# Patient Record
Sex: Male | Born: 1990
Health system: Southern US, Community
[De-identification: ages and names within clinical notes are randomized; demographics above are authoritative.]

## PROBLEM LIST (undated history)

## (undated) DIAGNOSIS — R748 Abnormal levels of other serum enzymes: Secondary | ICD-10-CM

## (undated) DIAGNOSIS — E559 Vitamin D deficiency, unspecified: Secondary | ICD-10-CM

## (undated) DIAGNOSIS — R030 Elevated blood-pressure reading, without diagnosis of hypertension: Secondary | ICD-10-CM

## (undated) DIAGNOSIS — E039 Hypothyroidism, unspecified: Secondary | ICD-10-CM

## (undated) DIAGNOSIS — R5383 Other fatigue: Secondary | ICD-10-CM

## (undated) DIAGNOSIS — E669 Obesity, unspecified: Secondary | ICD-10-CM

## (undated) DIAGNOSIS — Z87898 Personal history of other specified conditions: Secondary | ICD-10-CM

## (undated) HISTORY — DX: Abnormal levels of other serum enzymes: R74.8

## (undated) HISTORY — DX: Vitamin D deficiency, unspecified: E55.9

## (undated) HISTORY — DX: Obesity, unspecified: E66.9

## (undated) HISTORY — PX: HERNIA REPAIR: SHX51

## (undated) HISTORY — DX: Elevated blood-pressure reading, without diagnosis of hypertension: R03.0

## (undated) HISTORY — DX: Other fatigue: R53.83

## (undated) HISTORY — DX: Hypothyroidism, unspecified: E03.9

## (undated) HISTORY — DX: Personal history of other specified conditions: Z87.898

---
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
## 2010-05-16 ENCOUNTER — Emergency Department (HOSPITAL_COMMUNITY): Payer: No Typology Code available for payment source

## 2010-05-16 ENCOUNTER — Emergency Department (HOSPITAL_COMMUNITY)
Admission: EM | Admit: 2010-05-16 | Discharge: 2010-05-16 | Disposition: A | Discharge status: Home / Self Care | Payer: No Typology Code available for payment source | Attending: Emergency Medicine | Admitting: Emergency Medicine

## 2010-05-16 DIAGNOSIS — R079 Chest pain, unspecified: Secondary | ICD-10-CM | POA: Insufficient documentation

## 2010-05-16 DIAGNOSIS — M546 Pain in thoracic spine: Secondary | ICD-10-CM | POA: Insufficient documentation

## 2010-05-16 DIAGNOSIS — IMO0002 Reserved for concepts with insufficient information to code with codable children: Secondary | ICD-10-CM | POA: Insufficient documentation

## 2010-07-25 NOTE — Op Note (Signed)
Tanner Martin, Tanner Martin             ACCOUNT NO.:  [id number]   MEDICAL RECORD NO.:  [id number]          PATIENT TYPE:  AMB   LOCATION:  DSC                          FACILITY:  [facility]   PHYSICIAN:  Cherylynn Ridges, M.D.    DATE OF BIRTH:  1990/07/25   DATE OF PROCEDURE:  05/27/2006  DATE OF DISCHARGE:                               OPERATIVE REPORT   PREOPERATIVE DIAGNOSIS:  Right inguinal hernia.   POSTOPERATIVE DIAGNOSIS:  Indirect right inguinal hernia.   PROCEDURE:  Right inguinal hernia repair with mesh.   SURGEON:  Cherylynn Ridges, M.D.   ANESTHESIA:  General.   ESTIMATED BLOOD LOSS:  Less than 20 mL.   COMPLICATIONS:  None.   CONDITION:  Stable.   FINDINGS:  Indirect sac associated with a lipoma of the cord and small  weakness in the floor or direct defect.   INDICATIONS FOR OPERATION:  The patient is a 20 year old with a  symptomatic right inguinal hernia who comes in for repair.   OPERATION:  The patient was taken to the operating room and placed on  the table in the supine position.  After an adequate laryngeal airway  anesthetic was administered, he was prepped and draped in the usual  sterile manner exposing the right groin.   A transverse curvilinear incision was made at the level of the  superficial ring using a #10 blade.  This incision was about 5 cm long.  It was taken down to and through Scarpa's fascia.  We ligated the vein  in the subcu with 3-0 Vicryl.  We exposed the external oblique fascia  which was split along its fibers through the superficial ring.  We  preserved the ilioinguinal and iliohypogastric nerves.  We retracted the  spermatic cord with a Penrose drain at the pubic tubercle and dissected  away the cremasteric muscles.  We then placed the spermatic cord on a  work bench between the Penrose drain and then dissected out the indirect  sac at the anterior medial aspect of the cord.  We dissected down to the  base of the neck,  ligated it with a  hemostat clamp, and 2-0 Ethibond  suture ligatures at the base.  The excess sac and lipoma was resected.   We then repaired the floor using an oval piece of mesh measuring  approximately 4 x 2 cm in size, attaching it to the pubic tubercle area  and to the conjoined tendon anterior medially and the reflected portion  of the inguinal ligament inferolaterally.  This was done using a running  0 Prolene suture.  Antibiotic solution was used to irrigate the wound  and also to soak the mesh.  Once the mesh was implanted, we closed the  external oblique fascia on top of the cord using running 3-0 Vicryl  suture, taking care not to entrap the iliohypogastric and ilioinguinal  nerve.  Once this was done, we reapproximated Scarpa's fascia using  interrupted 3-0 Vicryl sutures and then the skin was closed using a  running subcuticular stitch of 4-0 Monocryl.  We injected 0.5% Marcaine  with epi into  the wound including a regional block at the anterior  superior iliac spine, a total of 16 mL was used.  A sterile dressing was  applied.  All needle counts,, sponge counts, and instrument counts were  correct.      Cherylynn Ridges, M.D.  Electronically Signed     JOW/MEDQ  D:  05/27/2006  T:  05/27/2006  Job:  [id number]

## 2011-01-08 ENCOUNTER — Emergency Department (HOSPITAL_COMMUNITY)
Admission: EM | Admit: 2011-01-08 | Discharge: 2011-01-08 | Disposition: A | Discharge status: Home / Self Care | Payer: No Typology Code available for payment source | Attending: Emergency Medicine | Admitting: Emergency Medicine

## 2011-01-08 ENCOUNTER — Emergency Department (HOSPITAL_COMMUNITY): Payer: No Typology Code available for payment source

## 2011-01-08 DIAGNOSIS — M545 Low back pain, unspecified: Secondary | ICD-10-CM | POA: Insufficient documentation

## 2011-07-12 ENCOUNTER — Encounter (HOSPITAL_COMMUNITY): Payer: Self-pay | Admitting: Emergency Medicine

## 2011-07-12 ENCOUNTER — Emergency Department (HOSPITAL_COMMUNITY)
Admission: EM | Admit: 2011-07-12 | Discharge: 2011-07-12 | Disposition: A | Discharge status: Home / Self Care | Attending: Emergency Medicine | Admitting: Emergency Medicine

## 2011-07-12 ENCOUNTER — Emergency Department (HOSPITAL_COMMUNITY)

## 2011-07-12 DIAGNOSIS — T07XXXA Unspecified multiple injuries, initial encounter: Secondary | ICD-10-CM

## 2011-07-12 DIAGNOSIS — S42023A Displaced fracture of shaft of unspecified clavicle, initial encounter for closed fracture: Secondary | ICD-10-CM | POA: Insufficient documentation

## 2011-07-12 DIAGNOSIS — S42002A Fracture of unspecified part of left clavicle, initial encounter for closed fracture: Secondary | ICD-10-CM

## 2011-07-12 DIAGNOSIS — IMO0002 Reserved for concepts with insufficient information to code with codable children: Secondary | ICD-10-CM | POA: Insufficient documentation

## 2011-07-12 MED ORDER — OXYCODONE-ACETAMINOPHEN 5-325 MG PO TABS: 2.0000 | ORAL_TABLET | ORAL | Status: AC | PRN

## 2011-07-12 MED ORDER — OXYCODONE-ACETAMINOPHEN 5-325 MG PO TABS
2.0000 | ORAL_TABLET | Freq: Once | ORAL | Status: AC
  Administered 2011-07-12: 2 via ORAL
  Filled 2011-07-12: qty 2

## 2011-07-12 MED ORDER — TETANUS-DIPHTH-ACELL PERTUSSIS 5-2.5-18.5 LF-MCG/0.5 IM SUSP
0.5000 mL | Freq: Once | INTRAMUSCULAR | Status: AC
  Administered 2011-07-12: 0.5 mL via INTRAMUSCULAR
  Filled 2011-07-12: qty 0.5
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
## 2011-07-12 NOTE — Discharge Instructions (Signed)
Deep wounds clean and dry. Wash with soap and water and cover with Band-Aid. Followup with orthopedist in the next week. Call on Monday for appointment. Wear sling for comfort. Take pain medicine as prescribed.  Abrasions Abrasions are skin scrapes. Their treatment depends on how large and deep the abrasion is. Abrasions do not extend through all layers of the skin. A cut or lesion through all skin layers is called a laceration. HOME CARE INSTRUCTIONS   If you were given a dressing, change it at least once a day or as instructed by your caregiver. If the bandage sticks, soak it off with a solution of water or hydrogen peroxide.   Twice a day, wash the area with soap and water to remove all the cream/ointment. You may do this in a sink, under a tub faucet, or in a shower. Rinse off the soap and pat dry with a clean towel. Look for signs of infection (see below).   Reapply cream/ointment according to your caregiver's instruction. This will help prevent infection and keep the bandage from sticking. Telfa or gauze over the wound and under the dressing or wrap will also help keep the bandage from sticking.   If the bandage becomes wet, dirty, or develops a foul smell, change it as soon as possible.   Only take over-the-counter or prescription medicines for pain, discomfort, or fever as directed by your caregiver.  SEEK IMMEDIATE MEDICAL CARE IF:   Increasing pain in the wound.   Signs of infection develop: redness, swelling, surrounding area is tender to touch, or pus coming from the wound.   You have a fever.   Any foul smell coming from the wound or dressing.  Most skin wounds heal within ten days. Facial wounds heal faster. However, an infection may occur despite proper treatment. You should have the wound checked for signs of infection within 24 to 48 hours or sooner if problems arise. If you were not given a wound-check appointment, look closely at the wound yourself on the second day for  early signs of infection listed above. MAKE SURE YOU:   Understand these instructions.   Will watch your condition.   Will get help right away if you are not doing well or get worse.  Document Released: 12/03/2004 Document Revised: 02/12/2011 Document Reviewed: 01/27/2011 [facility] Patient Information 2012 [place], [place]. Abrasions Abrasions are skin scrapes. Their treatment depends on how large and deep the abrasion is. Abrasions do not extend through all layers of the skin. A cut or lesion through all skin layers is called a laceration. HOME CARE INSTRUCTIONS   If you were given a dressing, change it at least once a day or as instructed by your caregiver. If the bandage sticks, soak it off with a solution of water or hydrogen peroxide.   Twice a day, wash the area with soap and water to remove all the cream/ointment. You may do this in a sink, under a tub faucet, or in a shower. Rinse off the soap and pat dry with a clean towel. Look for signs of infection (see below).   Reapply cream/ointment according to your caregiver's instruction. This will help prevent infection and keep the bandage from sticking. Telfa or gauze over the wound and under the dressing or wrap will also help keep the bandage from sticking.   If the bandage becomes wet, dirty, or develops a foul smell, change it as soon as possible.   Only take over-the-counter or prescription medicines for pain, discomfort, or  fever as directed by your caregiver.  SEEK IMMEDIATE MEDICAL CARE IF:   Increasing pain in the wound.   Signs of infection develop: redness, swelling, surrounding area is tender to touch, or pus coming from the wound.   You have a fever.   Any foul smell coming from the wound or dressing.  Most skin wounds heal within ten days. Facial wounds heal faster. However, an infection may occur despite proper treatment. You should have the wound checked for signs of infection within 24 to 48 hours or sooner if  problems arise. If you were not given a wound-check appointment, look closely at the wound yourself on the second day for early signs of infection listed above. MAKE SURE YOU:   Understand these instructions.   Will watch your condition.   Will get help right away if you are not doing well or get worse.  Document Released: 12/03/2004 Document Revised: 02/12/2011 Document Reviewed: 01/27/2011 [facility] Patient Information 2012 [place], [place].
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
## 2015-09-04 ENCOUNTER — Other Ambulatory Visit: Payer: Self-pay

## 2015-09-04 ENCOUNTER — Ambulatory Visit (INDEPENDENT_AMBULATORY_CARE_PROVIDER_SITE_OTHER): Payer: 59 | Admitting: Family Medicine

## 2015-09-04 ENCOUNTER — Encounter: Payer: Self-pay | Admitting: Family Medicine

## 2015-09-04 VITALS — BP 132/84 | HR 65 | Ht 67.5 in | Wt 241.8 lb

## 2015-09-04 DIAGNOSIS — E669 Obesity, unspecified: Secondary | ICD-10-CM

## 2015-09-04 DIAGNOSIS — R5383 Other fatigue: Secondary | ICD-10-CM

## 2015-09-04 DIAGNOSIS — R030 Elevated blood-pressure reading, without diagnosis of hypertension: Secondary | ICD-10-CM

## 2015-09-04 DIAGNOSIS — Z723 Lack of physical exercise: Secondary | ICD-10-CM

## 2015-09-04 HISTORY — DX: Elevated blood-pressure reading, without diagnosis of hypertension: R03.0

## 2015-09-04 HISTORY — DX: Obesity, unspecified: E66.9

## 2015-09-04 HISTORY — DX: Other fatigue: R53.83

## 2015-09-04 LAB — CBC WITH DIFFERENTIAL/PLATELET
BASOS ABS: 0 {cells}/uL (ref 0–200)
Basophils Relative: 0 %
EOS ABS: 244 {cells}/uL (ref 15–500)
EOS PCT: 4 %
HEMATOCRIT: 47.9 % (ref 38.5–50.0)
HEMOGLOBIN: 16.7 g/dL (ref 13.2–17.1)
LYMPHS ABS: 2074 {cells}/uL (ref 850–3900)
Lymphocytes Relative: 34 %
MCH: 34.2 pg — AB (ref 27.0–33.0)
MCHC: 34.9 g/dL (ref 32.0–36.0)
MCV: 98 fL (ref 80.0–100.0)
MONO ABS: 610 {cells}/uL (ref 200–950)
MPV: 11.6 fL (ref 7.5–12.5)
Monocytes Relative: 10 %
NEUTROS ABS: 3172 {cells}/uL (ref 1500–7800)
NEUTROS PCT: 52 %
Platelets: 162 10*3/uL (ref 140–400)
RBC: 4.89 MIL/uL (ref 4.20–5.80)
RDW: 13.4 % (ref 11.0–15.0)
WBC: 6.1 10*3/uL (ref 3.8–10.8)

## 2015-09-04 NOTE — Assessment & Plan Note (Addendum)
Connection between being overweight and various diseases reviewed with patient. Advised weight loss. Briefly discussed med management and patient will let me know in the future if this is something he would like to pursue.  We will obtain blood work to screen for diabetes, thyroid abnormality, hyperlipidemia etc.

## 2015-09-04 NOTE — Progress Notes (Signed)
Tanner Martin, D.O. [facility]  Subjective:    CC: New pt, here to establish care.   HPI: Tanner RainwaterJeffrey Martin is a pleasant 25 y.o. male who [facility]   Last seen physician in over a year ago. Saw Dr Jorge MandrilHiltz at [facility].    No active other issues.    Works at [employer].  [employer]achelor's at [employer] in [employer].   Has GF- 3 yrs, monogamous.       Past Medical History  Diagnosis Date  . No significant past medical history     Past Surgical History  Procedure Laterality Date  . Hernia repair      Family History  Problem Relation Age of Onset  . Healthy Mother   . Hypertension Father   . Healthy Brother   . Dementia Maternal Grandmother   . Heart attack Paternal Grandfather   . Healthy Brother     History  Drug Use No  ,  History  Alcohol Use  . 7.2 oz/week  . 12 Shots of liquor per week  ,  History  Smoking status  . Never Smoker   Smokeless tobacco  . Never Used  ,  History  Sexual Activity  . Sexual Activity: Yes  . Birth Control/ Protection: Condom    Patient's Medications   No medications on file    ALLERGIES: Ceclor  Review of Systems:   ( Completed via her Adult Medical History Intake form today ) General:   Denies fever, chills, appetite changes, unexplained weight loss.  Optho/Auditory:   Denies visual changes, blurred vision/LOV, ringing in ears/ diff hearing Respiratory:   Denies SOB, DOE, cough, wheezing.  Cardiovascular:   Denies chest pain, palpitations, new onset peripheral edema  Gastrointestinal:   Denies nausea, vomiting, diarrhea.  Genitourinary:    Denies dysuria, increased frequency, flank pain.  Endocrine:     Denies hot or cold intolerance, polyuria, polydipsia. Musculoskeletal:  Denies unexplained myalgias, joint swelling, arthralgias, gait problems.  Skin:  Denies rash, suspicious lesions or new/ changes in moles Neurological:    Denies  dizziness, syncope, unexplained weakness, lightheadedness, numbness  Psychiatric/Behavioral:   Denies mood changes, suicidal or homicidal ideations, hallucinations    Objective:   Blood pressure 132/84, pulse 65, height 5' 7.5" (1.715 m), weight 241 lb 12.8 oz (109.68 kg). Body mass index is 37.29 kg/(m^2).  General: Well Developed, well nourished, and in no acute distress.  Neuro: Alert and oriented x3, extra-ocular muscles intact, sensation grossly intact.  HEENT: Normocephalic, atraumatic, pupils equal round reactive to light, neck supple  Skin: no gross suspicious lesions or rashes  Cardiac: Regular rate and rhythm, no murmurs rubs or gallops.  Respiratory: Essentially clear to auscultation bilaterally. Not using accessory muscles, speaking in full sentences.  Abdominal: Soft, not grossly distended Musculoskeletal: Ambulates w/o diff, FROM * 4 ext.  Vasc: less 2 sec cap RF, warm and pink  Psych:  No HI/SI, judgement and insight good.    Impression and Recommendations:   Get fasting blood work on pt today.   ADVISED:  - low salt diet - stop caffeine, smoking - exercise- 30min daily, moderate intensity.  - wt loss-  BMI of 25 or less ( BMI calculator)  Look up Pre-Hypertension [email or web address].org  Prehypertension Counseled regarding the meaning of his blood pressure today. Please see AVS for further recommendations that were made and discussed with patient.  Handouts provided  Obesity Connection between being overweight and various diseases reviewed with patient. Advised weight loss. Briefly discussed med management and patient will let me know in the future if this is something he would like to pursue.  We will obtain blood work to screen for diabetes, thyroid abnormality, hyperlipidemia etc.  Mild Fatigue at times Obtain lab work today.  Inactivity Extensive routine counseling done regarding lifestyle modifications and recommendations from the American Heart Association  regarding exercise. Discussed with patient benefits of exercise.  The patient was counselled, risk factors were discussed, anticipatory guidance given.  Gross side effects, risk and benefits, and alternatives of medications discussed with patient.  Patient is aware that all medications have potential side effects and we are unable to predict every sideeffect or drug-drug interaction that may occur.  Expresses verbal understanding and consents to current therapy plan and treatment regiment.  Note: This document was prepared using Dragon voice recognition software and may include unintentional dictation errors.

## 2015-09-04 NOTE — Assessment & Plan Note (Signed)
Counseled regarding the meaning of his blood pressure today. Please see AVS for further recommendations that were made and discussed with patient. Handouts provided

## 2015-09-04 NOTE — Patient Instructions (Addendum)
- low salt diet - stop caffeine, smoking - exercise- daily, moderate intensity.  - wt loss-  BMI of 25 or less ( BMI calculator)  Pre-Hypertension [email or web address]     The Facts About High Blood Pressure Tweet  [id number]  Updated:May 10,2017   What is high blood pressure?  High blood pressure (HBP or hypertension) is when your blood pressure, the force of the blood flowing through your blood vessels, is consistently too high. If you have high blood pressure, you are not alone About 85 million Americans - one out of every three adults over age 25 - have high blood pressure. (Nearly one of out six don't even know they have it.)  The best way to know if you have high blood pressure it is to have your blood pressure checked. Know your numbers  Learn about checking your blood pressure numbers and what they mean. Blood Pressure Category Systolic mm Hg (upper #)  Diastolic mm Hg (lower #)  Normal less than 120  and less than 80   Prehypertension 120 - 139  or 80 - 89   High Blood Pressure (Hypertension) Stage 1 140 - 159  or 90 - 99   High Blood Pressure (Hypertension) Stage 2 160 or higher or 100 or higher  Hypertensive Crisis (Emergency care needed)  Higher than 180  or  Higher than 110   High blood pressure is a "silent killer"  Most of the time there are no obvious symptoms.  Certain physical traits and lifestyle choices can put you at a greater risk for developing high blood pressure.  When left untreated, the damage that high blood pressure does to your circulatory system is a significant contributing factor to heart attack, stroke and other health threats.     Managing Your High Blood Pressure Blood pressure is a measurement of how forceful your blood is pressing against the walls of the arteries. Arteries are muscular tubes within the circulatory system. Blood pressure does not stay the same. Blood pressure rises when you are active, excited, or nervous; and it  lowers during sleep and relaxation. If the numbers measuring your blood pressure stay above normal most of the time, you are at risk for health problems. High blood pressure (hypertension) is a long-term (chronic) condition in which blood pressure is elevated. A blood pressure reading is recorded as two numbers, such as 120 over 80 (or 120/80). The first, higher number is called the systolic pressure. It is a measure of the pressure in your arteries as the heart beats. The second, lower number is called the diastolic pressure. It is a measure of the pressure in your arteries as the heart relaxes between beats.  Keeping your blood pressure in a normal range is important to your overall health and prevention of health problems, such as heart disease and stroke. When your blood pressure is uncontrolled, your heart has to work harder than normal. High blood pressure is a very common condition in adults because blood pressure tends to rise with age. Men and women are equally likely to have hypertension but at different times in life. Before age 77, men are more likely to have hypertension. After 26 years of age, women are more likely to have it. Hypertension is especially common in African Americans. This condition often has no signs or symptoms. The cause of the condition is usually not known. Your caregiver can help you come up with a plan to keep your blood pressure in a normal, healthy  range. BLOOD PRESSURE STAGES Blood pressure is classified into four stages: normal, prehypertension, stage 1, and stage 2. Your blood pressure reading will be used to determine what type of treatment, if any, is necessary. Appropriate treatment options are tied to these four stages:  Normal  Systolic pressure (mm Hg): below 120.  Diastolic pressure (mm Hg): below 80. Prehypertension  Systolic pressure (mm Hg): 120 to 139.  Diastolic pressure (mm Hg): 80 to 89. Stage1  Systolic pressure (mm Hg): 140 to  159.  Diastolic pressure (mm Hg): 90 to 99. Stage2  Systolic pressure (mm Hg): 160 or above.  Diastolic pressure (mm Hg): 100 or above. RISKS RELATED TO HIGH BLOOD PRESSURE Managing your blood pressure is an important responsibility. Uncontrolled high blood pressure can lead to:  A heart attack.  A stroke.  A weakened blood vessel (aneurysm).  Heart failure.  Kidney damage.  Eye damage.  Metabolic syndrome.  Memory and concentration problems. HOW TO MANAGE YOUR BLOOD PRESSURE Blood pressure can be managed effectively with lifestyle changes and medicines (if needed). Your caregiver will help you come up with a plan to bring your blood pressure within a normal range. Your plan should include the following: Education  Read all information provided by your caregivers about how to control blood pressure.  Educate yourself on the latest guidelines and treatment recommendations. New research is always being done to further define the risks and treatments for high blood pressure. Lifestylechanges  Control your weight.  Avoid smoking.  Stay physically active.  Reduce the amount of salt in your diet.  Reduce stress.  Control any chronic conditions, such as high cholesterol or diabetes.  Reduce your alcohol intake. Medicines  Several medicines (antihypertensive medicines) are available, if needed, to bring blood pressure within a normal range. Communication  Review all the medicines you take with your caregiver because there may be side effects or interactions.  Talk with your caregiver about your diet, exercise habits, and other lifestyle factors that may be contributing to high blood pressure.  See your caregiver regularly. Your caregiver can help you create and adjust your plan for managing high blood pressure. RECOMMENDATIONS FOR TREATMENT AND FOLLOW-UP  The following recommendations are based on current guidelines for managing high blood pressure in nonpregnant  adults. Use these recommendations to identify the proper follow-up period or treatment option based on your blood pressure reading. You can discuss these options with your caregiver.  Systolic pressure of 120 to 139 or diastolic pressure of 80 to 89: Follow up with your caregiver as directed.  Systolic pressure of 140 to 160 or diastolic pressure of 90 to 100: Follow up with your caregiver within 2 months.  Systolic pressure above 160 or diastolic pressure above 100: Follow up with your caregiver within 1 month.  Systolic pressure above 180 or diastolic pressure above 110: Consider antihypertensive therapy; follow up with your caregiver within 1 week.  Systolic pressure above 200 or diastolic pressure above 120: Begin antihypertensive therapy; follow up with your caregiver within 1 week.   This information is not intended to replace advice given to you by your health care provider. Make sure you discuss any questions you have with your health care provider.   Document Released: 11/18/2011 Document Reviewed: 11/18/2011 Elsevier Interactive Patient Education 2016 [employer]     Hypertension Hypertension, commonly called high blood pressure, is when the force of blood pumping through your arteries is too strong. Your arteries are the blood vessels that carry blood from  your heart throughout your body. A blood pressure reading consists of a higher number over a lower number, such as 110/72. The higher number (systolic) is the pressure inside your arteries when your heart pumps. The lower number (diastolic) is the pressure inside your arteries when your heart relaxes. Ideally you want your blood pressure below 120/80. Hypertension forces your heart to work harder to pump blood. Your arteries may become narrow or stiff. Having untreated or uncontrolled hypertension can cause heart attack, stroke, kidney disease, and other problems. RISK FACTORS Some risk factors for high blood pressure are  controllable. Others are not.  Risk factors you cannot control include:   Race. You may be at higher risk if you are African American.  Age. Risk increases with age.  Gender. Men are at higher risk than women before age 26 years. After age 26, women are at higher risk than men. Risk factors you can control include:  Not getting enough exercise or physical activity.  Being overweight.  Getting too much fat, sugar, calories, or salt in your diet.  Drinking too much alcohol. SIGNS AND SYMPTOMS Hypertension does not usually cause signs or symptoms. Extremely high blood pressure (hypertensive crisis) may cause headache, anxiety, shortness of breath, and nosebleed. DIAGNOSIS To check if you have hypertension, your health care provider will measure your blood pressure while you are seated, with your arm held at the level of your heart. It should be measured at least twice using the same arm. Certain conditions can cause a difference in blood pressure between your right and left arms. A blood pressure reading that is higher than normal on one occasion does not mean that you need treatment. If it is not clear whether you have high blood pressure, you may be asked to return on a different day to have your blood pressure checked again. Or, you may be asked to monitor your blood pressure at home for 1 or more weeks. TREATMENT Treating high blood pressure includes making lifestyle changes and possibly taking medicine. Living a healthy lifestyle can help lower high blood pressure. You may need to change some of your habits. Lifestyle changes may include:  Following the DASH diet. This diet is high in fruits, vegetables, and whole grains. It is low in salt, red meat, and added sugars.  Keep your sodium intake below 2,300 mg per day.  Getting at least 30-45 minutes of aerobic exercise at least 4 times per week.  Losing weight if necessary.  Not smoking.  Limiting alcoholic beverages.  Learning  ways to reduce stress. Your health care provider may prescribe medicine if lifestyle changes are not enough to get your blood pressure under control, and if one of the following is true:  You are 618-26 years of age and your systolic blood pressure is above 140.  You are 26 years of age or older, and your systolic blood pressure is above 150.  Your diastolic blood pressure is above 90.  You have diabetes, and your systolic blood pressure is over 140 or your diastolic blood pressure is over 90.  You have kidney disease and your blood pressure is above 140/90.  You have heart disease and your blood pressure is above 140/90. Your personal target blood pressure may vary depending on your medical conditions, your age, and other factors. HOME CARE INSTRUCTIONS  Have your blood pressure rechecked as directed by your health care provider.   Take medicines only as directed by your health care provider. Follow the directions carefully.  Blood pressure medicines must be taken as prescribed. The medicine does not work as well when you skip doses. Skipping doses also puts you at risk for problems.  Do not smoke.   Monitor your blood pressure at home as directed by your health care provider. SEEK MEDICAL CARE IF:   You think you are having a reaction to medicines taken.  You have recurrent headaches or feel dizzy.  You have swelling in your ankles.  You have trouble with your vision. SEEK IMMEDIATE MEDICAL CARE IF:  You develop a severe headache or confusion.  You have unusual weakness, numbness, or feel faint.  You have severe chest or abdominal pain.  You vomit repeatedly.  You have trouble breathing. MAKE SURE YOU:   Understand these instructions.  Will watch your condition.  Will get help right away if you are not doing well or get worse.   This information is not intended to replace advice given to you by your health care provider. Make sure you discuss any questions you  have with your health care provider.   Document Released: 02/23/2005 Document Revised: 07/10/2014 Document Reviewed: 12/16/2012 Elsevier Interactive Patient Education [employer]

## 2015-09-04 NOTE — Assessment & Plan Note (Signed)
Obtain lab work today

## 2015-09-04 NOTE — Assessment & Plan Note (Signed)
Extensive routine counseling done regarding lifestyle modifications and recommendations from the American Heart Association regarding exercise. Discussed with patient benefits of exercise.

## 2015-09-05 LAB — COMPREHENSIVE METABOLIC PANEL
ALBUMIN: 4.2 g/dL (ref 3.6–5.1)
ALK PHOS: 47 U/L (ref 40–115)
ALT: 271 U/L — ABNORMAL HIGH (ref 9–46)
AST: 131 U/L — ABNORMAL HIGH (ref 10–40)
BILIRUBIN TOTAL: 1.3 mg/dL — AB (ref 0.2–1.2)
BUN: 13 mg/dL (ref 7–25)
CALCIUM: 9.1 mg/dL (ref 8.6–10.3)
CHLORIDE: 102 mmol/L (ref 98–110)
CO2: 26 mmol/L (ref 20–31)
Creat: 0.92 mg/dL (ref 0.60–1.35)
GLUCOSE: 92 mg/dL (ref 65–99)
Potassium: 3.7 mmol/L (ref 3.5–5.3)
SODIUM: 138 mmol/L (ref 135–146)
TOTAL PROTEIN: 7.1 g/dL (ref 6.1–8.1)

## 2015-09-05 LAB — HEMOGLOBIN A1C
Hgb A1c MFr Bld: 4.7 % (ref <5.7)
Mean Plasma Glucose: 88 mg/dL

## 2015-09-05 LAB — VITAMIN D 25 HYDROXY (VIT D DEFICIENCY, FRACTURES): Vit D, 25-Hydroxy: 15 ng/mL — ABNORMAL LOW (ref 30–100)

## 2015-09-05 LAB — LIPID PANEL
CHOL/HDL RATIO: 3.8 ratio (ref <=5.0)
Cholesterol: 204 mg/dL — ABNORMAL HIGH (ref 125–200)
HDL: 54 mg/dL (ref >=40)
LDL CALC: 119 mg/dL (ref <130)
Triglycerides: 155 mg/dL — ABNORMAL HIGH (ref <150)
VLDL: 31 mg/dL — ABNORMAL HIGH (ref <30)

## 2015-09-05 LAB — TSH: TSH: 5.98 mIU/L — ABNORMAL HIGH (ref 0.40–4.50)

## 2015-09-06 ENCOUNTER — Encounter: Payer: Self-pay | Admitting: Family Medicine

## 2015-09-09 NOTE — Telephone Encounter (Signed)
Called pt and advised him to call office to schedule appt to discuss weight loss medications, per Dr. Sharee Holsterpalski.  Tanner Martin. Harmonie Verrastro, CMA

## 2015-09-19 ENCOUNTER — Encounter: Payer: Self-pay | Admitting: Family Medicine

## 2015-09-19 ENCOUNTER — Ambulatory Visit (INDEPENDENT_AMBULATORY_CARE_PROVIDER_SITE_OTHER): Payer: 59 | Admitting: Family Medicine

## 2015-09-19 VITALS — BP 120/82 | HR 71 | Ht 67.5 in | Wt 240.5 lb

## 2015-09-19 DIAGNOSIS — Z723 Lack of physical exercise: Secondary | ICD-10-CM

## 2015-09-19 DIAGNOSIS — R748 Abnormal levels of other serum enzymes: Secondary | ICD-10-CM | POA: Diagnosis not present

## 2015-09-19 DIAGNOSIS — E559 Vitamin D deficiency, unspecified: Secondary | ICD-10-CM

## 2015-09-19 DIAGNOSIS — E038 Other specified hypothyroidism: Secondary | ICD-10-CM

## 2015-09-19 DIAGNOSIS — F1010 Alcohol abuse, uncomplicated: Secondary | ICD-10-CM | POA: Diagnosis not present

## 2015-09-19 DIAGNOSIS — E039 Hypothyroidism, unspecified: Secondary | ICD-10-CM

## 2015-09-19 DIAGNOSIS — Z87898 Personal history of other specified conditions: Secondary | ICD-10-CM

## 2015-09-19 HISTORY — DX: Hypothyroidism, unspecified: E03.9

## 2015-09-19 HISTORY — DX: Personal history of other specified conditions: Z87.898

## 2015-09-19 HISTORY — DX: Vitamin D deficiency, unspecified: E55.9

## 2015-09-19 HISTORY — DX: Abnormal levels of other serum enzymes: R74.8

## 2015-09-19 MED ORDER — VITAMIN D3 125 MCG (5000 UT) PO CAPS: 5000.0000 [IU] | ORAL_CAPSULE | ORAL | Status: DC

## 2015-09-19 MED ORDER — ERGOCALCIFEROL 1.25 MG (50000 UT) PO CAPS: 50000.0000 [IU] | ORAL_CAPSULE | ORAL | Status: DC

## 2015-09-19 MED ORDER — LEVOTHYROXINE SODIUM 50 MCG PO TABS: ORAL_TABLET | ORAL | Status: DC

## 2015-09-19 NOTE — Assessment & Plan Note (Signed)
Continue exercise every other day.

## 2015-09-19 NOTE — Assessment & Plan Note (Signed)
Discussed with patient that he will go 50 g daily and we will recheck in 6-8 weeks.

## 2015-09-19 NOTE — Assessment & Plan Note (Signed)
Explained to patient he will need daily and weekly doses of vitamin D supplement.

## 2015-09-19 NOTE — Patient Instructions (Addendum)
Take 5000 international units vitamin D3 daily, by that over-the-counter  Use milk thistle daily-   "For liver damage from drugs or toxins, 160-800 milligrams of silymarin (e.g. Legalon) has been taken by mouth daily in three divided doses for periods ranging from 15 days to five weeks; 70 milligrams of silibinin has been taken by mouth three times daily for 6-12 months.  No alcohol repeat your liver enzymes in 1 month after you abstain from alcohol.  Long discussion with patient about all labs. Handouts provided.

## 2015-09-19 NOTE — Progress Notes (Signed)
Subjective:    Chief Complaint  Patient presents with  . Results    review recent lab results    HPI: Tanner Martin is a 25 y.o. male who presents to [place] at [facility] today  For follow-up. He has been jogging every other day for 20 minutes. He has not put any salt to his food like prior. He is quit drinking his tequila and beer and has only had 1 glass of wine nightly. Prior he had been drinking 1/2 gallon of tequila every 2 nights. He also added in some beer 7-8 per night in [place] weekends on days where he did not feel like drinking the tequila.  He has no abdominal symptoms he denies any nausea vomiting diarrhea or any problems with gastritis after eating. He still does complain of some fatigue.     Past Medical History  Diagnosis Date  . Hypothyroidism 09/19/2015  . Obesity 09/04/2015  . Mild Fatigue at times 09/04/2015  . Prehypertension 09/04/2015  . Elevated liver enzymes 09/19/2015  . History of heavy alcohol consumption 09/19/2015  . Vitamin D deficiency 09/19/2015     Past Surgical History  Procedure Laterality Date  . Hernia repair       Family History  Problem Relation Age of Onset  . Healthy Mother   . Hypertension Father   . Healthy Brother   . Dementia Maternal Grandmother   . Heart attack Paternal Grandfather   . Healthy Brother      History  Drug Use No  ,  History  Alcohol Use  . 7.2 oz/week  . 12 Shots of liquor per week  ,  History  Smoking status  . Never Smoker   Smokeless tobacco  . Never Used  ,  History  Sexual Activity  . Sexual Activity: Yes  . Birth Control/ Protection: Condom      No current outpatient prescriptions on file prior to visit.   No current facility-administered medications on file prior to visit.    Allergies  Allergen Reactions  . Ceclor [Cefaclor] Hives and Swelling      Review of Systems:  ( Completed via adult medical history intake form today ) General:  Denies  fever, chills, appetite changes, unexplained weight loss.  Respiratory: Denies SOB, DOE, cough, wheezing.  Cardiovascular: Denies chest pain, palpitations.  Gastrointestinal: Denies nausea, vomiting, diarrhea, abdominal pain.  Genitourinary: Denies dysuria, increased frequency, flank pain. Endocrine: Denies hot or cold intolerance, polyuria, polydipsia. Musculoskeletal: Denies myalgias, back pain, joint swelling, arthralgias, gait problems.  Skin: Denies pallor, rash, suspicious lesions.  Neurological: Denies dizziness, seizures, syncope, unexplained weakness, lightheadedness, numbness and headaches.  Psychiatric/Behavioral: Denies mood changes, suicidal or homicidal ideations, hallucinations, sleep disturbances.    Objective:    Blood pressure 120/82, pulse 71, height 5' 7.5" (1.715 m), weight 240 lb 8 oz (109.09 kg). Body mass index is 37.09 kg/(m^2). General: Well Developed, well nourished, and in no acute distress.  HEENT: Normocephalic, atraumatic, pupils equal round reactive to light, neck supple, No carotid bruits no JVD Skin: Warm and dry, cap RF less 2 sec Cardiac: Regular rate and rhythm, S1, S2 WNL's, no murmurs rubs or gallops Respiratory: ECTA B/L, Not using accessory muscles, speaking in full sentences. NeuroM-Sk: Ambulates w/o assistance, moves ext * 4 w/o difficulty, sensation grossly intact.  Psych: A and O *3, judgement and insight good.       Recent Results (from the past 2160 hour(s))  CBC  with Differential/Platelet     Status: Abnormal   Collection Time: 09/04/15  9:25 AM  Result Value Ref Range   WBC 6.1 3.8 - 10.8 K/uL   RBC 4.89 4.20 - 5.80 MIL/uL   Hemoglobin 16.7 13.2 - 17.1 g/dL   HCT 47.9 38.5 - 50.0 %   MCV 98.0 80.0 - 100.0 fL   MCH 34.2 (H) 27.0 - 33.0 pg   MCHC 34.9 32.0 - 36.0 g/dL   RDW 13.4 11.0 - 15.0 %   Platelets 162 140 - 400 K/uL   MPV 11.6 7.5 - 12.5 fL   Neutro Abs 3172 1500 - 7800 cells/uL   Lymphs Abs 2074 850 - 3900 cells/uL    Monocytes Absolute 610 200 - 950 cells/uL   Eosinophils Absolute 244 15 - 500 cells/uL   Basophils Absolute 0 0 - 200 cells/uL   Neutrophils Relative % 52 %   Lymphocytes Relative 34 %   Monocytes Relative 10 %   Eosinophils Relative 4 %   Basophils Relative 0 %   Smear Review Criteria for review not met     Comment: ** Please note change in unit of measure and reference range(s). **  Comprehensive metabolic panel     Status: Abnormal   Collection Time: 09/04/15  9:25 AM  Result Value Ref Range   Sodium 138 135 - 146 mmol/L   Potassium 3.7 3.5 - 5.3 mmol/L   Chloride 102 98 - 110 mmol/L   CO2 26 20 - 31 mmol/L   Glucose, Bld 92 65 - 99 mg/dL   BUN 13 7 - 25 mg/dL   Creat 0.92 0.60 - 1.35 mg/dL   Total Bilirubin 1.3 (H) 0.2 - 1.2 mg/dL   Alkaline Phosphatase 47 40 - 115 U/L   AST 131 (H) 10 - 40 U/L   ALT 271 (H) 9 - 46 U/L   Total Protein 7.1 6.1 - 8.1 g/dL   Albumin 4.2 3.6 - 5.1 g/dL   Calcium 9.1 8.6 - 10.3 mg/dL  TSH     Status: Abnormal   Collection Time: 09/04/15  9:25 AM  Result Value Ref Range   TSH 5.98 (H) 0.40 - 4.50 mIU/L  Hemoglobin A1c     Status: None   Collection Time: 09/04/15  9:25 AM  Result Value Ref Range   Hgb A1c MFr Bld 4.7 <5.7 %    Comment:   For the purpose of screening for the presence of diabetes:   <5.7%       Consistent with the absence of diabetes 5.7-6.4 %   Consistent with increased risk for diabetes (prediabetes) >=6.5 %     Consistent with diabetes   This assay result is consistent with a decreased risk of diabetes.   Currently, no consensus exists regarding use of hemoglobin A1c for diagnosis of diabetes in children.   According to American Diabetes Association (ADA) guidelines, hemoglobin A1c <7.0% represents optimal control in non-pregnant diabetic patients. Different metrics may apply to specific patient populations. Standards of Medical Care in Diabetes (ADA).      Mean Plasma Glucose 88 mg/dL  VITAMIN D 25 Hydroxy (Vit-D  Deficiency, Fractures)     Status: Abnormal   Collection Time: 09/04/15  9:25 AM  Result Value Ref Range   Vit D, 25-Hydroxy 15 (L) 30 - 100 ng/mL    Comment: Vitamin D Status           25-OH Vitamin D        Deficiency                <  20 ng/mL        Insufficiency         20 - 29 ng/mL        Optimal             > or = 30 ng/mL   For 25-OH Vitamin D testing on patients on D2-supplementation and patients for whom quantitation of D2 and D3 fractions is required, the QuestAssureD 25-OH VIT D, (D2,D3), LC/MS/MS is recommended: order code [id number] (patients > 2 yrs).   Lipid panel     Status: Abnormal   Collection Time: 09/04/15  9:25 AM  Result Value Ref Range   Cholesterol 204 (H) 125 - 200 mg/dL   Triglycerides 155 (H) <150 mg/dL   HDL 54 >=40 mg/dL   Total CHOL/HDL Ratio 3.8 <=5.0 Ratio   VLDL 31 (H) <30 mg/dL   LDL Cholesterol 119 <130 mg/dL    Comment:   Total Cholesterol/HDL Ratio:CHD Risk                        Coronary Heart Disease Risk Table                                        Men       Women          1/2 Average Risk              3.4        3.3              Average Risk              5.0        4.4           2X Average Risk              9.6        7.1           3X Average Risk             23.4       11.0 Use the calculated Patient Ratio above and the CHD Risk table  to determine the patient's CHD Risk.         Impression and Recommendations:    The patient was counselled, risk factors were discussed, anticipatory guidance given.   Hypothyroidism Discussed with patient that he will go 50 g daily and we will recheck in 6-8 weeks.  Inactivity Continue exercise every other day.  Elevated liver enzymes Absolute no alcohol for the next 4 weeks until you return to clinic for follow-up and we will recheck her liver enzymes at that time. If they remain elevated I explained to patient that we will need to obtain further labs to rule out other hepatic pathology and  even possibly imaging.  Vitamin D deficiency Explained to patient he will need daily and weekly doses of vitamin D supplement.    Meds ordered this encounter  Medications  . Cholecalciferol (VITAMIN D3) 5000 units CAPS    Sig: Take 1 capsule (5,000 Units total) by mouth 1 day or 1 dose.    Dispense:  90 capsule    Refill:  12  . ergocalciferol (VITAMIN D2) 50000 units capsule    Sig: Take 1 capsule (50,000 Units total) by mouth once a week.    Dispense:  12 capsule  Refill:  10  . levothyroxine (SYNTHROID, LEVOTHROID) 50 MCG tablet    Sig: Take one half tablet for 1 week and then 1 tablet daily    Dispense:  90 tablet    Refill:  1    Please see AVS handed out to patient at the end of our visit for further patient instructions/ counseling done pertaining to today's office visit.  Gross side effects, risk and benefits, and alternatives of medications discussed with patient.  Patient is aware that all medications have potential side effects and we are unable to predict every sideeffect or drug-drug interaction that may occur.  Expresses verbal understanding and consents to current therapy plan and treatment regiment.  Note: This document was prepared using Dragon voice recognition software and may include unintentional dictation errors.

## 2015-09-19 NOTE — Assessment & Plan Note (Signed)
Absolute no alcohol for the next 4 weeks until you return to clinic for follow-up and we will recheck her liver enzymes at that time. If they remain elevated I explained to patient that we will need to obtain further labs to rule out other hepatic pathology and even possibly imaging.

## 2015-10-17 ENCOUNTER — Encounter: Payer: Self-pay | Admitting: Family Medicine

## 2015-10-17 ENCOUNTER — Ambulatory Visit (INDEPENDENT_AMBULATORY_CARE_PROVIDER_SITE_OTHER): Payer: 59 | Admitting: Family Medicine

## 2015-10-17 VITALS — BP 123/74 | HR 64 | Wt 241.0 lb

## 2015-10-17 DIAGNOSIS — R748 Abnormal levels of other serum enzymes: Secondary | ICD-10-CM | POA: Diagnosis not present

## 2015-10-17 DIAGNOSIS — E038 Other specified hypothyroidism: Secondary | ICD-10-CM | POA: Diagnosis not present

## 2015-10-17 DIAGNOSIS — F1010 Alcohol abuse, uncomplicated: Secondary | ICD-10-CM

## 2015-10-17 DIAGNOSIS — E669 Obesity, unspecified: Secondary | ICD-10-CM | POA: Diagnosis not present

## 2015-10-17 DIAGNOSIS — Z87898 Personal history of other specified conditions: Secondary | ICD-10-CM

## 2015-10-17 DIAGNOSIS — R030 Elevated blood-pressure reading, without diagnosis of hypertension: Secondary | ICD-10-CM

## 2015-10-17 DIAGNOSIS — Z723 Lack of physical exercise: Secondary | ICD-10-CM

## 2015-10-17 DIAGNOSIS — E559 Vitamin D deficiency, unspecified: Secondary | ICD-10-CM

## 2015-10-17 NOTE — Patient Instructions (Signed)
  If he can try to increase physical activity to at least 5 days a week of 30 minutes at a pop. This can be divided into 15 minutes in the morning and 15 minutes in the p.m. if you so desire..  Continue abstinence.      Exercising to Lose Weight Exercising can help you to lose weight. In order to lose weight through exercise, you need to do vigorous-intensity exercise. You can tell that you are exercising with vigorous intensity if you are breathing very hard and fast and cannot hold a conversation while exercising. Moderate-intensity exercise helps to maintain your current weight. You can tell that you are exercising at a moderate level if you have a higher heart rate and faster breathing, but you are still able to hold a conversation. HOW OFTEN SHOULD I EXERCISE? Choose an activity that you enjoy and set realistic goals. Your health care provider can help you to make an activity plan that works for you. Exercise regularly as directed by your health care provider. This may include:  Doing resistance training twice each week, such as:  Push-ups.  Sit-ups.  Lifting weights.  Using resistance bands.  Doing a given intensity of exercise for a given amount of time. Choose from these options:  150 minutes of moderate-intensity exercise every week.  75 minutes of vigorous-intensity exercise every week.  A mix of moderate-intensity and vigorous-intensity exercise every week. Children, pregnant women, people who are out of shape, people who are overweight, and older adults may need to consult a health care provider for individual recommendations. If you have any sort of medical condition, be sure to consult your health care provider before starting a new exercise program. WHAT ARE SOME ACTIVITIES THAT CAN HELP ME TO LOSE WEIGHT?   Walking at a rate of at least 4.5 miles an hour.  Jogging or running at a rate of 5 miles per hour.  Biking at a rate of at least 10 miles per hour.  Lap  swimming.  Roller-skating or in-line skating.  Cross-country skiing.  Vigorous competitive sports, such as football, basketball, and soccer.  Jumping rope.  Aerobic dancing. HOW CAN I BE MORE ACTIVE IN MY DAY-TO-DAY ACTIVITIES?  Use the stairs instead of the elevator.  Take a walk during your lunch break.  If you drive, park your car farther away from work or school.  If you take public transportation, get off one stop early and walk the rest of the way.  Make all of your phone calls while standing up and walking around.  Get up, stretch, and walk around every 30 minutes throughout the day. WHAT GUIDELINES SHOULD I FOLLOW WHILE EXERCISING?  Do not exercise so much that you hurt yourself, feel dizzy, or get very short of breath.  Consult your health care provider prior to starting a new exercise program.  Wear comfortable clothes and shoes with good support.  Drink plenty of water while you exercise to prevent dehydration or heat stroke. Body water is lost during exercise and must be replaced.  Work out until you breathe faster and your heart beats faster.   This information is not intended to replace advice given to you by your health care provider. Make sure you discuss any questions you have with your health care provider.   Document Released: 03/28/2010 Document Revised: 03/16/2014 Document Reviewed: 07/27/2013 Elsevier Interactive Patient Education [employer]2016 [employer]vier Inc.

## 2015-10-17 NOTE — Progress Notes (Signed)
Impression and Recommendations:    1. Elevated liver enzymes   2. Prehypertension   3. Obesity   4. Other specified hypothyroidism   5. Inactivity   6. Vitamin D deficiency   7. History of heavy alcohol consumption      Recheck total bili direct and in direct today, AST and ALT. Continue abstinence strongly encouraged.  What pressure blood pressure improved with lifestyle changes and decrease alcohol intake as well as exercising a bit.  Encouraged more of his healthier habits.  Pointed out to patient again that BMI of 37+ can be detrimental to health. This is something we will work on in the future.  TSH recheck 6-8 weeks after increase. This would be around September 1.  Continue vitamin D weekly and daily doses.  Continue abstinence  Patient's Medications  New Prescriptions   No medications on file  Previous Medications   CHOLECALCIFEROL (VITAMIN D3) 5000 UNITS CAPS    Take 1 capsule (5,000 Units total) by mouth 1 day or 1 dose.   ERGOCALCIFEROL (VITAMIN D2) 50000 UNITS CAPSULE    Take 1 capsule (50,000 Units total) by mouth once a week.   LEVOTHYROXINE (SYNTHROID, LEVOTHROID) 50 MCG TABLET    Take one half tablet for 1 week and then 1 tablet daily  Modified Medications   No medications on file  Discontinued Medications   No medications on file    Return for f/up around sept 1 for tsh reck; then in 3 mo- lifestyle changes. .  The patient was counseled, risk factors were discussed, anticipatory guidance given.  Gross side effects, risk and benefits, and alternatives of medications discussed with patient.  Patient is aware that all medications have potential side effects and we are unable to predict every side effect or drug-drug interaction that may occur.  Expresses verbal understanding and consents to current therapy plan and treatment regimen.  Please see AVS handed out to patient at the end of our visit for further patient instructions/ counseling done  pertaining to today's office visit.    Note: This document was prepared using Dragon voice recognition software and may include unintentional dictation errors.   --------------------------------------------------------------------------------------------------------------------------------------------------------------------------------------------------------------------------------------------    Subjective:    CC:  Chief Complaint  Patient presents with  . Abnormal Lab    HPI: Tanner Martin is a 25 y.o. male who presents to [facility] today for issues as discussed below.   pt has totally abstained from etoh,  Exercising 2 times wkly- jogging 20 min or so.   tolerateing the vit D wkly and daily  Tolerating inc in synthroid   -No other concerns or questions   Wt Readings from Last 3 Encounters:  10/17/15 241 lb (109.3 kg)  09/19/15 240 lb 8 oz (109.1 kg)  09/04/15 241 lb 12.8 oz (109.7 kg)   BP Readings from Last 3 Encounters:  10/17/15 123/74  09/19/15 120/82  09/04/15 132/84   Pulse Readings from Last 3 Encounters:  10/17/15 64  09/19/15 71  09/04/15 65     Patient Active Problem List   Diagnosis Date Noted  . Hypothyroidism 09/19/2015  . Elevated liver enzymes 09/19/2015  . History of heavy alcohol consumption 09/19/2015  . Vitamin D deficiency 09/19/2015  . Prehypertension 09/04/2015  . Obesity 09/04/2015  . Inactivity 09/04/2015  . Mild Fatigue at times 09/04/2015    Past Medical history, Surgical history, Family history, Social history, Allergies and Medications have been entered into the  medical record, reviewed and changed as needed.   Allergies:  Allergies  Allergen Reactions  . Ceclor [Cefaclor] Hives and Swelling    Review of Systems: No fever/ chills, night sweats, no unintended weight loss, No chest pain, or increased shortness of breath. No N/V/D.  Pertinent positives and negatives noted in HPI above     Objective:   Blood pressure 123/74, pulse 64, weight 241 lb (109.3 kg), SpO2 97 %. Body mass index is 37.19 kg/m.  General: Well Developed, well nourished, appropriate for stated age.  Neuro: Alert and oriented x3, extra-ocular muscles intact, sensation grossly intact.  HEENT: Normocephalic, atraumatic, neck supple   Skin: Warm and dry, no gross rash. Cardiac: RRR, S1 S2,  no murmurs rubs or gallops.  Respiratory: ECTA B/L, Not using accessory muscles, speaking in full sentences-unlabored. Vascular:  No gross lower ext edema, cap RF less 2 sec. Psych: No SI/HI, Insight and judgement good

## 2015-10-18 LAB — ALT: ALT: 103 U/L — ABNORMAL HIGH (ref 9–46)

## 2015-10-18 LAB — AST: AST: 47 U/L — ABNORMAL HIGH (ref 10–40)

## 2015-11-07 ENCOUNTER — Other Ambulatory Visit: Payer: Self-pay

## 2015-11-07 DIAGNOSIS — E039 Hypothyroidism, unspecified: Secondary | ICD-10-CM

## 2015-11-08 ENCOUNTER — Other Ambulatory Visit: Payer: 59

## 2016-01-23 ENCOUNTER — Ambulatory Visit: Payer: 59 | Admitting: Family Medicine

## 2016-02-10 ENCOUNTER — Ambulatory Visit (INDEPENDENT_AMBULATORY_CARE_PROVIDER_SITE_OTHER): Payer: 59 | Admitting: Family Medicine

## 2016-02-10 DIAGNOSIS — Z5329 Procedure and treatment not carried out because of patient's decision for other reasons: Secondary | ICD-10-CM

## 2016-02-10 NOTE — Progress Notes (Signed)
No SHOW

## 2016-05-19 ENCOUNTER — Other Ambulatory Visit: Payer: Self-pay | Admitting: Family Medicine

## 2016-05-19 NOTE — Telephone Encounter (Signed)
Pt needs TSH before refilling.  LVM for pt to call to discuss.  Tanner Martin. Aleaha Fickling, CMA

## 2016-05-20 NOTE — Telephone Encounter (Signed)
LVM for pt to call to discuss refill and need for labs.  Tanner Martin. Tanner Martin, CMA

## 2016-05-21 NOTE — Telephone Encounter (Signed)
Pt informed and scheduled for 05/22/16 for TSH.  Tanner Martin. Nelson, CMA

## 2016-05-22 ENCOUNTER — Other Ambulatory Visit: Payer: 59

## 2016-05-26 NOTE — Telephone Encounter (Signed)
Pt did not show for lab on 05/22/16.  LVM for pt to please call the office to reschedule his lab appointment.  Reminded pt that we can not refill thyroid medication until he comes in for this blood test.  Tiajuana Amass. Nelson, CMA

## 2017-06-08 ENCOUNTER — Encounter: Payer: Self-pay | Admitting: Family Medicine

## 2017-06-14 ENCOUNTER — Other Ambulatory Visit: Payer: Self-pay

## 2017-06-14 DIAGNOSIS — L600 Ingrowing nail: Secondary | ICD-10-CM

## 2017-06-29 ENCOUNTER — Encounter: Payer: Self-pay | Admitting: Sports Medicine

## 2017-06-29 ENCOUNTER — Ambulatory Visit (INDEPENDENT_AMBULATORY_CARE_PROVIDER_SITE_OTHER): Payer: 59 | Admitting: Sports Medicine

## 2017-06-29 VITALS — BP 144/87 | HR 84

## 2017-06-29 DIAGNOSIS — L03031 Cellulitis of right toe: Secondary | ICD-10-CM

## 2017-06-29 DIAGNOSIS — M79675 Pain in left toe(s): Secondary | ICD-10-CM | POA: Diagnosis not present

## 2017-06-29 DIAGNOSIS — L03032 Cellulitis of left toe: Secondary | ICD-10-CM | POA: Diagnosis not present

## 2017-06-29 DIAGNOSIS — M79674 Pain in right toe(s): Secondary | ICD-10-CM | POA: Diagnosis not present

## 2017-06-29 MED ORDER — SULFAMETHOXAZOLE-TRIMETHOPRIM 400-80 MG PO TABS: 1.0000 | ORAL_TABLET | Freq: Two times a day (BID) | ORAL | 0 refills | Status: DC

## 2017-06-29 NOTE — Progress Notes (Signed)
Subjective: Daley Gosse is a 27 y.o. male patient presents to office today complaining of a moderately painful incurvated, red, hot, swollen Right>Left hallux nail borders of the 1st toes. This has been present for 6 months. Patient has treated this by soaking, trimming, cotton balls. Patient denies fever/chills/nausea/vomitting/any other related constitutional symptoms at this time.  Review of Systems  All other systems reviewed and are negative.  Patient Active Problem List   Diagnosis Date Noted  . Hypothyroidism 09/19/2015  . Elevated liver enzymes 09/19/2015  . History of heavy alcohol consumption 09/19/2015  . Vitamin D deficiency 09/19/2015  . Prehypertension 09/04/2015  . Obesity 09/04/2015  . Inactivity 09/04/2015  . Mild Fatigue at times 09/04/2015    Current Outpatient Medications on File Prior to Visit  Medication Sig Dispense Refill  . Cholecalciferol (VITAMIN D3) 5000 units CAPS Take 1 capsule (5,000 Units total) by mouth 1 day or 1 dose. 90 capsule 12  . ergocalciferol (VITAMIN D2) 50000 units capsule Take 1 capsule (50,000 Units total) by mouth once a week. 12 capsule 10  . levothyroxine (SYNTHROID, LEVOTHROID) 50 MCG tablet Take one half tablet for 1 week and then 1 tablet daily 90 tablet 1   No current facility-administered medications on file prior to visit.     Allergies  Allergen Reactions  . Ceclor [Cefaclor] Hives and Swelling    Objective:  There were no vitals filed for this visit.  General: Well developed, nourished, in no acute distress, alert and oriented x3   Dermatology: Skin is warm, dry and supple bilateral. Right>Left hallux nail appears to be  severely incurvated with hyperkeratosis formation at the distal aspects of  the medial and lateral nail borders. (+) Erythema. (+) Edema. (+) serosanguous  drainage present. The remaining nails appear unremarkable at this time. There are no open sores, lesions or other signs of infection   present.  Vascular: Dorsalis Pedis artery and Posterior Tibial artery pedal pulses are 2/4 bilateral with immedate capillary fill time. Pedal hair growth present. No lower extremity edema.   Neruologic: Grossly intact via light touch bilateral.  Musculoskeletal: Tenderness to palpation of the Right and left hallux nail folds. Muscular strength within normal limits in all groups bilateral.   Assesement and Plan: Problem List Items Addressed This Visit    None    Visit Diagnoses    Paronychia of great toe of right foot    -  Primary   Relevant Medications   sulfamethoxazole-trimethoprim (BACTRIM) 400-80 MG tablet   Paronychia of great toe of left foot       Relevant Medications   sulfamethoxazole-trimethoprim (BACTRIM) 400-80 MG tablet   Toe pain, bilateral          -Discussed treatment alternatives and plan of care; Explained permanent/temporary nail avulsion and post procedure course to patient. - After a verbal consent, injected 3 ml of a 50:50 mixture of 2% plain  lidocaine and 0.5% plain marcaine in a normal hallux block fashion. Next, a betadine prep was performed. Anesthesia was tested and found to be appropriate.  The offending right and left hallux nails were then incised from the hyponychium to the epinychium. The total nails were removed and cleared from the field. The area was curretted for any remaining nail or spicules. Area was flushed with saline and dressed with antibiotic cream and a dry sterile dressing. -Patient was instructed to leave the dressing intact for today and begin soaking  in a weak solution of betadine or Epsom salt and  water tomorrow. Patient was instructed to  soak for 15 minutes each day and apply neosporin and a gauze or bandaid dressing each day. -Patient was instructed to monitor the toes for signs of infection and return to office if toe becomes red, hot or swollen. -Work note given  -Advised ice, elevation, and tylenol or motrin if needed for  pain.  -Patient is to return in 2 weeks for follow up care/nail check or sooner if problems arise.  Asencion Islamitorya Yaira Bernardi, DPM

## 2017-06-29 NOTE — Patient Instructions (Signed)

## 2017-07-13 ENCOUNTER — Ambulatory Visit (INDEPENDENT_AMBULATORY_CARE_PROVIDER_SITE_OTHER): Payer: 59 | Admitting: Sports Medicine

## 2017-07-13 ENCOUNTER — Encounter: Payer: Self-pay | Admitting: Sports Medicine

## 2017-07-13 DIAGNOSIS — M79674 Pain in right toe(s): Secondary | ICD-10-CM

## 2017-07-13 DIAGNOSIS — M79675 Pain in left toe(s): Secondary | ICD-10-CM

## 2017-07-13 DIAGNOSIS — Z9889 Other specified postprocedural states: Secondary | ICD-10-CM

## 2017-07-13 NOTE — Patient Instructions (Signed)
Long Term Care Instructions-Post Nail Surgery  You have had your ingrown toenails treated. This can drain for 6-8 weeks or longer.  It is important to keep this area clean, covered, and follow the soaking instructions dispensed at the time of your surgery.  This area will eventually dry and form a scab.  Once the scab forms you no longer need to soak or apply a dressing.  If at any time you experience an increase in pain, redness, swelling, or drainage, you should contact the office as soon as possible.

## 2017-07-13 NOTE — Progress Notes (Signed)
Subjective: Tanner Martin is a 27 y.o. male patient returns to office today for follow up evaluation after having Right and Left Hallux total temporary nail avulsions performed on (06-29-17). Patient has been soaking using epsom salt and applying topical antibiotic covered with bandaid daily. Patient deniesfever/chills/nausea/vomitting/any other related constitutional symptoms at this time.  Patient Active Problem List   Diagnosis Date Noted  . Hypothyroidism 09/19/2015  . Elevated liver enzymes 09/19/2015  . History of heavy alcohol consumption 09/19/2015  . Vitamin D deficiency 09/19/2015  . Prehypertension 09/04/2015  . Obesity 09/04/2015  . Inactivity 09/04/2015  . Mild Fatigue at times 09/04/2015    Current Outpatient Medications on File Prior to Visit  Medication Sig Dispense Refill  . Cholecalciferol (VITAMIN D3) 5000 units CAPS Take 1 capsule (5,000 Units total) by mouth 1 day or 1 dose. 90 capsule 12  . ergocalciferol (VITAMIN D2) 50000 units capsule Take 1 capsule (50,000 Units total) by mouth once a week. 12 capsule 10  . levothyroxine (SYNTHROID, LEVOTHROID) 50 MCG tablet Take one half tablet for 1 week and then 1 tablet daily 90 tablet 1  . sulfamethoxazole-trimethoprim (BACTRIM) 400-80 MG tablet Take 1 tablet by mouth 2 (two) times daily. 28 tablet 0   No current facility-administered medications on file prior to visit.     Allergies  Allergen Reactions  . Ceclor [Cefaclor] Hives and Swelling    Objective:  General: Well developed, nourished, in no acute distress, alert and oriented x3   Dermatology: Skin is warm, dry and supple bilateral. Right and left hallux nail bed appears to be clean, dry, with mild granular tissue and surrounding eschar/scab and maceration. (-) Erythema. (-) Edema. (-) serosanguous drainage present. The remaining nails appear unremarkable at this time. There are no other lesions or other signs of infection  present.  Neurovascular status:  Intact. No lower extremity swelling; No pain with calf compression bilateral.  Musculoskeletal: Decreased tenderness to palpation of the right and left hallux nail folds. Muscular strength within normal limits bilateral.   Assesement and Plan: Problem List Items Addressed This Visit    None    Visit Diagnoses    S/P nail surgery    -  Primary   Toe pain, bilateral          -Examined patient  -Cleansed right and left hallux nail beds and gently scrubbed with peroxide and q-tip/curetted away eschar at site and applied antibiotic cream covered with bandaid.  -Discussed plan of care with patient. -Patient to now begin soaking in a weak solution of Epsom salt and warm water. Patient was instructed to soak for 15-20 minutes each day until the toe appears normal and there is no drainage, redness, tenderness, or swelling at the procedure site, and apply neosporin and a gauze or bandaid dressing each day as needed. May leave open to air at night. -Educated patient on long term care after nail surgery. -Patient was instructed to monitor the toes for reoccurrence and signs of infection; Patient advised to return to office or go to ER if toe becomes red, hot or swollen. -Patient is to return as needed or sooner if problems arise.  Asencion Islam, DPM

## 2019-06-27 ENCOUNTER — Ambulatory Visit: Payer: 59 | Admitting: Family Medicine

## 2020-06-14 ENCOUNTER — Ambulatory Visit: Payer: 59 | Admitting: Family Medicine

## 2020-06-14 ENCOUNTER — Other Ambulatory Visit: Payer: Self-pay

## 2020-06-14 DIAGNOSIS — M25572 Pain in left ankle and joints of left foot: Secondary | ICD-10-CM | POA: Diagnosis not present

## 2020-06-14 MED ORDER — PREDNISONE 10 MG PO TABS: ORAL_TABLET | ORAL | 0 refills | Status: AC

## 2020-06-14 NOTE — Progress Notes (Signed)
   Office Visit Note   Patient: Tanner Martin           Date of Birth: [birth year]           MRN: [id number] Visit Date: 06/14/2020 Requested by: Mayer Masker, PA-C [address] [place],  [place] [address] PCP: Mayer Masker, PA-C  Subjective: Chief Complaint  Patient presents with  . Left Ankle - Pain    Woke up with pain yesterday, in the lateral and posterior ankle. NKI. Swelling. Hurts to bear weight on it. Hurts even when not bearing weight. He iced the ankle last night - pain not as severe today.    HPI: He is here with left ankle pain.  He woke up yesterday with pain, no injury.  Pain got steadily worse to the point that he could hardly bear weight.  No personal or family history of gout to his knowledge.  No recent change in his diet or activities to account for his pain.  He is using ibuprofen and ice with some improvement.  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               ROS:   All other systems were reviewed and are negative.  Objective: Vital Signs: There were no vitals taken for this visit.  Physical Exam:  General:  Alert and oriented, in no acute distress. Pulm:  Breathing unlabored. Psy:  Normal mood, congruent affect. Skin: Slight erythema, increased warmth over the anterolateral left ankle. Left ankle: He has 1+ joint effusion, very tender to palpation around the anterior and lateral joint line.  Good ligament stability.  Imaging: No results found.  Assessment & Plan: 1.  Acute left ankle pain, suspicious for gout -Prednisone, ice as needed.  If symptoms persist will obtain x-rays and possibly do some lab studies.     Procedures: No procedures performed        PMFS History: Patient Active Problem List   Diagnosis Date Noted  . Hypothyroidism 09/19/2015  . Elevated liver enzymes 09/19/2015  . History of heavy alcohol consumption 09/19/2015  . Vitamin D deficiency 09/19/2015  . Prehypertension 09/04/2015  .  Obesity 09/04/2015  . Inactivity 09/04/2015  . Mild Fatigue at times 09/04/2015   Past Medical History:  Diagnosis Date  . Elevated liver enzymes 09/19/2015  . History of heavy alcohol consumption 09/19/2015  . Hypothyroidism 09/19/2015  . Mild Fatigue at times 09/04/2015  . Obesity 09/04/2015  . Prehypertension 09/04/2015  . Vitamin D deficiency 09/19/2015    Family History  Problem Relation Age of Onset  . Healthy Mother   . Hypertension Father   . Healthy Brother   . Dementia Maternal Grandmother   . Heart attack Paternal Grandfather   . Healthy Brother     Past Surgical History:  Procedure Laterality Date  . HERNIA REPAIR     Social History   Occupational History  . Not on file  Tobacco Use  . Smoking status: Never Smoker  . Smokeless tobacco: Never Used  Substance and Sexual Activity  . Alcohol use: Yes    Alcohol/week: 12.0 standard drinks    Types: 12 Shots of liquor per week  . Drug use: No  . Sexual activity: Yes    Birth control/protection: Condom
